# Patient Record
Sex: Male | Born: 1970
Health system: Southern US, Community
[De-identification: ages and names within clinical notes are randomized; demographics above are authoritative.]

## PROBLEM LIST (undated history)

## (undated) DIAGNOSIS — R0981 Nasal congestion: Secondary | ICD-10-CM

## (undated) DIAGNOSIS — B009 Herpesviral infection, unspecified: Secondary | ICD-10-CM

## (undated) HISTORY — PX: NOSE SURGERY: SHX723

---

## 2011-08-11 ENCOUNTER — Other Ambulatory Visit (INDEPENDENT_AMBULATORY_CARE_PROVIDER_SITE_OTHER): Payer: Self-pay | Admitting: Otolaryngology

## 2011-08-19 ENCOUNTER — Ambulatory Visit
Admission: RE | Admit: 2011-08-19 | Discharge: 2011-08-19 | Disposition: A | Discharge status: Home / Self Care | Payer: 59 | Source: Ambulatory Visit | Attending: Otolaryngology | Admitting: Otolaryngology

## 2011-10-23 ENCOUNTER — Encounter: Payer: Self-pay | Admitting: *Deleted

## 2011-10-23 ENCOUNTER — Emergency Department (HOSPITAL_COMMUNITY)
Admission: EM | Admit: 2011-10-23 | Discharge: 2011-10-23 | Disposition: A | Discharge status: Home / Self Care | Payer: 59 | Attending: Emergency Medicine | Admitting: Emergency Medicine

## 2011-10-23 DIAGNOSIS — H53149 Visual discomfort, unspecified: Secondary | ICD-10-CM | POA: Insufficient documentation

## 2011-10-23 DIAGNOSIS — G44019 Episodic cluster headache, not intractable: Secondary | ICD-10-CM | POA: Insufficient documentation

## 2011-10-23 DIAGNOSIS — H5789 Other specified disorders of eye and adnexa: Secondary | ICD-10-CM | POA: Insufficient documentation

## 2011-10-23 HISTORY — DX: Herpesviral infection, unspecified: B00.9

## 2011-10-23 HISTORY — DX: Nasal congestion: R09.81

## 2011-10-23 MED ORDER — PREDNISONE 20 MG PO TABS
40.0000 mg | ORAL_TABLET | Freq: Once | ORAL | Status: AC
  Administered 2011-10-23: 40 mg via ORAL
  Filled 2011-10-23: qty 2

## 2011-10-23 MED ORDER — PREDNISONE 20 MG PO TABS: 40.0000 mg | ORAL_TABLET | Freq: Every day | ORAL | Status: AC

## 2011-10-23 MED ORDER — KETOROLAC TROMETHAMINE 30 MG/ML IJ SOLN
30.0000 mg | Freq: Once | INTRAMUSCULAR | Status: AC
  Administered 2011-10-23: 30 mg via INTRAMUSCULAR
  Filled 2011-10-23: qty 1

## 2011-10-23 NOTE — ED Provider Notes (Signed)
History    40yM with HA. Sudden onset. Sharp, stabbing pain. Episodes last minutes to up to an hour then resolve. R sided. R temporal region and R face. Tearing R eye and photophobia. No change in visual acuity.  Feels congested. No rhinorrhea. Redness R eye. No neck pain or stiffness. Denies trauma. No fever or chills. Similar HAs past couple weeks.   CSN: [id number]  Arrival date & time 10/23/11  1335   First MD Initiated Contact with Patient 10/23/11 1601      Chief Complaint  Patient presents with  . Headache    (Consider location/radiation/quality/duration/timing/severity/associated sxs/prior treatment) HPI  Past Medical History  Diagnosis Date  . Sinus congestion   . Herpes     History reviewed. No pertinent past surgical history.  History reviewed. No pertinent family history.  History  Substance Use Topics  . Smoking status: Never Smoker   . Smokeless tobacco: Not on file  . Alcohol Use: No      Review of Systems   Review of symptoms negative unless otherwise noted in HPI.  Allergies  Review of patient's allergies indicates no known allergies.  Home Medications   Current Outpatient Rx  Name Route Sig Dispense Refill  . ACYCLOVIR 800 MG PO TABS Oral Take 800 mg by mouth 2 (two) times daily.      . ASPIRIN-ACETAMINOPHEN-CAFFEINE 250-250-65 MG PO TABS Oral Take 1 tablet by mouth every 6 (six) hours as needed. For migraine       . PHENYLEPHRINE-IBUPROFEN 10-200 MG PO TABS Oral Take 1 tablet by mouth every 6 (six) hours as needed. For pain     . PREDNISONE 20 MG PO TABS Oral Take 2 tablets (40 mg total) by mouth daily. 8 tablet 0    BP 126/75  Pulse 78  Temp(Src) 98 F (36.7 C) (Oral)  Resp 22  SpO2 100%  Physical Exam  Nursing note and vitals reviewed. Constitutional: He is oriented to person, place, and time. He appears well-developed and well-nourished. No distress.  HENT:  Head: Normocephalic and atraumatic.  Right Ear: External ear normal.    Left Ear: External ear normal.  Mouth/Throat: Oropharynx is clear and moist.       No facial tenderness  Eyes: Conjunctivae are normal. Pupils are equal, round, and reactive to light. Right eye exhibits no discharge. Left eye exhibits no discharge. No scleral icterus.       Mild injection R conjunctiva. Tactile tonometry wnl. Corneas clear.  Neck: Normal range of motion. Neck supple.  Cardiovascular: Normal rate, regular rhythm and normal heart sounds.  Exam reveals no gallop and no friction rub.   No murmur heard. Pulmonary/Chest: Effort normal and breath sounds normal. No respiratory distress.  Abdominal: Soft. He exhibits no distension. There is no tenderness.  Musculoskeletal: He exhibits no edema and no tenderness.  Lymphadenopathy:    He has no cervical adenopathy.  Neurological: He is alert and oriented to person, place, and time. No cranial nerve deficit. He exhibits normal muscle tone. Coordination normal.       Normal appearing gait  Skin: Skin is warm and dry.  Psychiatric: He has a normal mood and affect. His behavior is normal. Thought content normal.    ED Course  Procedures (including critical care time)  Labs Reviewed - No data to display No results found.   1. Episodic cluster headache       MDM  40yM with HA. Suspect primary HA, specifically cluster HA given description of  symptoms and benign exam. Consider secondary causes. Doubt glaucoma given intermittent nature, exam and no visual complaints. Highly unlikely temporal arteritis given age. Doubt bleed or mass. No meningismus. Afebrile. Not on blood thinning medications. No hx of hypercoaguable state, but consider venous thrombosis. Doubt vertebral/carotid artery dissection. Discussed suspicion and plan of action but could tell seemed somewhat dissapointed in suspected diagnosis and plan of action. Will give trial of steroids and encouraged to use nsaids prn. Outpt fu for further tx and discuss other options such  ccb or triptans.        Raeford Razor, MD 10/23/11 [id number]

## 2011-10-23 NOTE — ED Notes (Signed)
Pt reports sudden onset of severe pain to top of head and to face today. Denies n/v. No acute distress noted at triage.

## 2016-12-14 DIAGNOSIS — B009 Herpesviral infection, unspecified: Secondary | ICD-10-CM | POA: Insufficient documentation

## 2016-12-21 ENCOUNTER — Ambulatory Visit (HOSPITAL_COMMUNITY)
Admission: EM | Admit: 2016-12-21 | Discharge: 2016-12-21 | Disposition: A | Discharge status: Home / Self Care | Payer: 59 | Attending: Family Medicine | Admitting: Family Medicine

## 2016-12-21 ENCOUNTER — Encounter (HOSPITAL_COMMUNITY): Payer: Self-pay | Admitting: Emergency Medicine

## 2016-12-21 DIAGNOSIS — R05 Cough: Secondary | ICD-10-CM | POA: Diagnosis not present

## 2016-12-21 DIAGNOSIS — J208 Acute bronchitis due to other specified organisms: Secondary | ICD-10-CM | POA: Diagnosis not present

## 2016-12-21 DIAGNOSIS — R062 Wheezing: Secondary | ICD-10-CM

## 2016-12-21 DIAGNOSIS — R059 Cough, unspecified: Secondary | ICD-10-CM

## 2016-12-21 MED ORDER — ALBUTEROL SULFATE (2.5 MG/3ML) 0.083% IN NEBU
2.5000 mg | INHALATION_SOLUTION | Freq: Once | RESPIRATORY_TRACT | Status: AC
  Administered 2016-12-21: 2.5 mg via RESPIRATORY_TRACT

## 2016-12-21 MED ORDER — AMOXICILLIN 875 MG PO TABS: 875.0000 mg | ORAL_TABLET | Freq: Two times a day (BID) | ORAL | 0 refills | Status: DC

## 2016-12-21 MED ORDER — METHYLPREDNISOLONE 4 MG PO TBPK: ORAL_TABLET | ORAL | 0 refills | Status: DC

## 2016-12-21 MED ORDER — SODIUM CHLORIDE 0.9 % IN NEBU
INHALATION_SOLUTION | RESPIRATORY_TRACT | Status: AC
  Filled 2016-12-21: qty 3

## 2016-12-21 MED ORDER — ALBUTEROL SULFATE HFA 108 (90 BASE) MCG/ACT IN AERS: 1.0000 | INHALATION_SPRAY | Freq: Four times a day (QID) | RESPIRATORY_TRACT | 0 refills | Status: AC | PRN

## 2016-12-21 MED ORDER — ALBUTEROL SULFATE (2.5 MG/3ML) 0.083% IN NEBU
INHALATION_SOLUTION | RESPIRATORY_TRACT | Status: AC
  Filled 2016-12-21: qty 3

## 2016-12-21 MED ORDER — BENZONATATE 100 MG PO CAPS: 200.0000 mg | ORAL_CAPSULE | Freq: Three times a day (TID) | ORAL | 0 refills | Status: DC | PRN

## 2016-12-21 NOTE — ED Provider Notes (Signed)
CSN: [id number]     Arrival date & time 12/21/16  1830 History   First MD Initiated Contact with Patient 12/21/16 1915     Chief Complaint  Patient presents with  . Cough   (Consider location/radiation/quality/duration/timing/severity/associated sxs/prior Treatment) Patient c/o cough and wheezing for 2 weeks.   The history is provided by the patient and the spouse.  Cough  Cough characteristics:  Productive Sputum characteristics:  Yellow Severity:  Severe Onset quality:  Sudden Duration:  2 weeks Timing:  Constant Progression:  Worsening Chronicity:  New Smoker: no   Context: upper respiratory infection and weather changes   Relieved by:  Nothing Worsened by:  Nothing   Past Medical History:  Diagnosis Date  . Herpes   . Sinus congestion    History reviewed. No pertinent surgical history. History reviewed. No pertinent family history. Social History  Substance Use Topics  . Smoking status: Never Smoker  . Smokeless tobacco: Not on file  . Alcohol use No    Review of Systems  Constitutional: Positive for fatigue.  HENT: Negative.   Eyes: Negative.   Respiratory: Positive for cough.   Cardiovascular: Negative.   Gastrointestinal: Negative.   Endocrine: Negative.   Genitourinary: Negative.   Musculoskeletal: Negative.   Skin: Negative.   Allergic/Immunologic: Negative.   Neurological: Negative.   Hematological: Negative.   Psychiatric/Behavioral: Negative.     Allergies  Patient has no known allergies.  Home Medications   Prior to Admission medications   Medication Sig Start Date End Date Taking? Authorizing Provider  albuterol (PROVENTIL HFA;VENTOLIN HFA) 108 (90 Base) MCG/ACT inhaler Inhale 1-2 puffs into the lungs every 6 (six) hours as needed for wheezing or shortness of breath. 12/21/16   Deatra Canter, FNP  amoxicillin (AMOXIL) 875 MG tablet Take 1 tablet (875 mg total) by mouth 2 (two) times daily. 12/21/16   Deatra Canter, FNP  benzonatate  (TESSALON) 100 MG capsule Take 2 capsules (200 mg total) by mouth 3 (three) times daily as needed for cough. 12/21/16   Deatra Canter, FNP  methylPREDNISolone (MEDROL DOSEPAK) 4 MG TBPK tablet Take 6-5-4-3-2-1- po qd 12/21/16   Deatra Canter, FNP   Meds Ordered and Administered this Visit   Medications  albuterol (PROVENTIL) (2.5 MG/3ML) 0.083% nebulizer solution 2.5 mg (2.5 mg Nebulization Given 12/21/16 1931)    BP 129/90 (BP Location: Left Arm)   Pulse 82   Temp 98.9 F (37.2 C) (Oral)   Resp 20   SpO2 98%  No data found.   Physical Exam  Constitutional: He appears well-developed and well-nourished.  HENT:  Head: Normocephalic and atraumatic.  Right Ear: External ear normal.  Left Ear: External ear normal.  Mouth/Throat: Oropharynx is clear and moist.  Eyes: Conjunctivae and EOM are normal. Pupils are equal, round, and reactive to light.  Neck: Normal range of motion. Neck supple.  Cardiovascular: Normal rate, regular rhythm and normal heart sounds.   Pulmonary/Chest: Effort normal. He has wheezes.  Abdominal: Soft. Bowel sounds are normal.  Nursing note and vitals reviewed.   Urgent Care Course     Procedures (including critical care time)  Labs Review Labs Reviewed - No data to display  Imaging Review No results found.   Visual Acuity Review  Right Eye Distance:   Left Eye Distance:   Bilateral Distance:    Right Eye Near:   Left Eye Near:    Bilateral Near:         MDM  1. Acute bronchitis due to other specified organisms   2. Cough   3. Wheezing    Albuterol Neb treatment  Medrol dose pack Albuterol MDI Amoxicillin 875mg  one po bid x 7 days #14 Tessalon Perles 100mg  2 po tid prn       Deatra CanterWilliam J Daelyn Mozer, FNP 12/21/16 2002

## 2016-12-21 NOTE — ED Triage Notes (Signed)
The patient presented to the [facility] with a complaint of a cough x 2 weeks. 

## 2017-08-07 DIAGNOSIS — G43009 Migraine without aura, not intractable, without status migrainosus: Secondary | ICD-10-CM | POA: Insufficient documentation

## 2017-10-31 ENCOUNTER — Encounter (HOSPITAL_COMMUNITY): Payer: Self-pay | Admitting: Emergency Medicine

## 2017-10-31 ENCOUNTER — Other Ambulatory Visit: Payer: Self-pay

## 2017-10-31 ENCOUNTER — Ambulatory Visit (HOSPITAL_COMMUNITY)
Admission: EM | Admit: 2017-10-31 | Discharge: 2017-10-31 | Disposition: A | Discharge status: Home / Self Care | Payer: 59 | Attending: Family Medicine | Admitting: Family Medicine

## 2017-10-31 DIAGNOSIS — J0140 Acute pansinusitis, unspecified: Secondary | ICD-10-CM | POA: Diagnosis not present

## 2017-10-31 MED ORDER — HYDROCOD POLST-CPM POLST ER 10-8 MG/5ML PO SUER: 5.0000 mL | Freq: Every evening | ORAL | 0 refills | Status: DC | PRN

## 2017-10-31 MED ORDER — FLUTICASONE PROPIONATE 50 MCG/ACT NA SUSP: 2.0000 | Freq: Every day | NASAL | 0 refills | Status: DC

## 2017-10-31 MED ORDER — AMOXICILLIN-POT CLAVULANATE 875-125 MG PO TABS: 1.0000 | ORAL_TABLET | Freq: Two times a day (BID) | ORAL | 0 refills | Status: DC

## 2017-10-31 MED ORDER — IPRATROPIUM BROMIDE 0.06 % NA SOLN: 2.0000 | Freq: Four times a day (QID) | NASAL | 0 refills | Status: DC

## 2017-10-31 MED ORDER — CETIRIZINE-PSEUDOEPHEDRINE ER 5-120 MG PO TB12: 1.0000 | ORAL_TABLET | Freq: Every day | ORAL | 0 refills | Status: DC

## 2017-10-31 MED ORDER — BENZONATATE 100 MG PO CAPS: 100.0000 mg | ORAL_CAPSULE | Freq: Three times a day (TID) | ORAL | 0 refills | Status: DC

## 2017-10-31 NOTE — ED Triage Notes (Signed)
Patient started feeling sick last week.  Symptoms are runny nose, headache, cough, and facial pain on right side of face

## 2017-10-31 NOTE — ED Provider Notes (Signed)
[facility]    CSN: [id number] Arrival date & time: 10/31/17  1842     History   Chief Complaint Chief Complaint  Patient presents with  . URI    HPI Brian Padilla is a 47 y.o. male.   [age] year old male comes in for a 1 week history of URI symptoms.  He has had rhinorrhea, headache, nonproductive cough, facial pain, nasal congestion.  Denies fever, chills, night sweats. Denies chest pain, shortness of breath, wheezing. otc nyquil, nasal spray without relief. No sick contact. Never smoker.       Past Medical History:  Diagnosis Date  . Herpes   . Sinus congestion     There are no active problems to display for this patient.   Past Surgical History:  Procedure Laterality Date  . NOSE SURGERY         Home Medications    Prior to Admission medications   Medication Sig Start Date End Date Taking? Authorizing Provider  azelastine (ASTELIN) 0.1 % nasal spray Place into both nostrils 2 (two) times daily. Use in each nostril as directed   Yes [provider]  fluticasone (VERAMYST) 27.5 MCG/SPRAY nasal spray Place 2 sprays into the nose daily.   Yes [provider]  albuterol (PROVENTIL HFA;VENTOLIN HFA) 108 (90 Base) MCG/ACT inhaler Inhale 1-2 puffs into the lungs every 6 (six) hours as needed for wheezing or shortness of breath. 12/21/16   Deatra Canterxford, William J, FNP  amoxicillin-clavulanate (AUGMENTIN) 875-125 MG tablet Take 1 tablet by mouth every 12 (twelve) hours. 10/31/17   Belinda FisherYu, Sigmund Morera V, PA-C    Family History Family History  Problem Relation Age of Onset  . Healthy Mother     Social History Social History   Tobacco Use  . Smoking status: Never Smoker  Substance Use Topics  . Alcohol use: No  . Drug use: No     Allergies   Patient has no known allergies.   Review of Systems Review of Systems  Reason unable to perform ROS: See HPI as above.     Physical Exam Triage Vital Signs ED Triage Vitals  Enc Vitals Group     BP  10/31/17 1955 (!) 136/92     Pulse Rate 10/31/17 1955 70     Resp 10/31/17 1955 20     Temp 10/31/17 1955 (!) 97.2 F (36.2 C)     Temp Source 10/31/17 1955 Oral     SpO2 10/31/17 1955 96 %     Weight --      Height --      Head Circumference --      Peak Flow --      Pain Score 10/31/17 1956 7     Pain Loc --      Pain Edu? --      Excl. in GC? --    No data found.  Updated Vital Signs BP (!) 136/92 (BP Location: Left Arm) Comment: notified RN  Pulse 70   Temp (!) 97.2 F (36.2 C) (Oral) Comment: notified RN  Resp 20   SpO2 96%   Physical Exam  Constitutional: He is oriented to person, place, and time. He appears well-developed and well-nourished. No distress.  HENT:  Head: Normocephalic and atraumatic.  Right Ear: External ear and ear canal normal. Tympanic membrane is erythematous. Tympanic membrane is not bulging.  Left Ear: External ear and ear canal normal. Tympanic membrane is erythematous. Tympanic membrane is not bulging.  Nose: Mucosal edema and rhinorrhea  present. Right sinus exhibits maxillary sinus tenderness and frontal sinus tenderness. Left sinus exhibits maxillary sinus tenderness and frontal sinus tenderness.  Mouth/Throat: Uvula is midline, oropharynx is clear and moist and mucous membranes are normal.  Eyes: Conjunctivae are normal. Pupils are equal, round, and reactive to light.  Neck: Normal range of motion. Neck supple.  Cardiovascular: Normal rate, regular rhythm and normal heart sounds. Exam reveals no gallop and no friction rub.  No murmur heard. Pulmonary/Chest: Effort normal and breath sounds normal. He has no decreased breath sounds. He has no wheezes. He has no rhonchi. He has no rales.  Lymphadenopathy:    He has no cervical adenopathy.  Neurological: He is alert and oriented to person, place, and time.  Skin: Skin is warm and dry.  Psychiatric: He has a normal mood and affect. His behavior is normal. Judgment normal.     UC Treatments /  Results  Labs (all labs ordered are listed, but only abnormal results are displayed) Labs Reviewed - No data to display  EKG  EKG Interpretation None       Radiology No results found.  Procedures Procedures (including critical care time)  Medications Ordered in UC Medications - No data to display   Initial Impression / Assessment and Plan / UC Course  I have reviewed the triage vital signs and the nursing notes.  Pertinent labs & imaging results that were available during my care of the patient were reviewed by me and considered in my medical decision making (see chart for details).    Augmentin for sinusitis. Continue nasal sprays. Return precautions given. Patient expresses understanding and agrees to plan.   Final Clinical Impressions(s) / UC Diagnoses   Final diagnoses:  Acute non-recurrent pansinusitis    ED Discharge Orders        Ordered    fluticasone (FLONASE) 50 MCG/ACT nasal spray  Daily,   Status:  Discontinued     10/31/17 2025    cetirizine-pseudoephedrine (ZYRTEC-D) 5-120 MG tablet  Daily,   Status:  Discontinued     10/31/17 2025    benzonatate (TESSALON) 100 MG capsule  Every 8 hours,   Status:  Discontinued     10/31/17 2025    chlorpheniramine-HYDROcodone (TUSSIONEX PENNKINETIC ER) 10-8 MG/5ML SUER  At bedtime PRN,   Status:  Discontinued     10/31/17 2025    ipratropium (ATROVENT) 0.06 % nasal spray  4 times daily,   Status:  Discontinued     10/31/17 2025    amoxicillin-clavulanate (AUGMENTIN) 875-125 MG tablet  Every 12 hours     10/31/17 2031       Belinda Fisher, PA-C 10/31/17 2055

## 2017-10-31 NOTE — Discharge Instructions (Signed)
Start augmentin for sinus infection. Continue nasal sprays as directed. You can use over the counter nasal saline rinse such as neti pot for nasal congestion. Keep hydrated, your urine should be clear to pale yellow in color. Tylenol/motrin for fever and pain. Monitor for any worsening of symptoms, chest pain, shortness of breath, wheezing, swelling of the throat, follow up for reevaluation.

## 2017-11-22 ENCOUNTER — Ambulatory Visit (HOSPITAL_COMMUNITY)
Admission: EM | Admit: 2017-11-22 | Discharge: 2017-11-22 | Disposition: A | Discharge status: Home / Self Care | Payer: 59 | Attending: Family Medicine | Admitting: Family Medicine

## 2017-11-22 ENCOUNTER — Encounter (HOSPITAL_COMMUNITY): Payer: Self-pay | Admitting: Emergency Medicine

## 2017-11-22 DIAGNOSIS — J069 Acute upper respiratory infection, unspecified: Secondary | ICD-10-CM | POA: Diagnosis not present

## 2017-11-22 DIAGNOSIS — R059 Cough, unspecified: Secondary | ICD-10-CM

## 2017-11-22 DIAGNOSIS — B9789 Other viral agents as the cause of diseases classified elsewhere: Secondary | ICD-10-CM

## 2017-11-22 DIAGNOSIS — R05 Cough: Secondary | ICD-10-CM

## 2017-11-22 MED ORDER — HYDROCOD POLST-CPM POLST ER 10-8 MG/5ML PO SUER: 5.0000 mL | Freq: Every evening | ORAL | 0 refills | Status: DC | PRN

## 2017-11-22 MED ORDER — BENZONATATE 100 MG PO CAPS: 100.0000 mg | ORAL_CAPSULE | Freq: Three times a day (TID) | ORAL | 0 refills | Status: DC

## 2017-11-22 NOTE — Discharge Instructions (Signed)
Please take cough medication as prescribed.  Please make sure you are drinking lots of fluids.  Follow-up for any fevers, worsening cough, shortness of breath, urgent changes in your health.

## 2017-11-22 NOTE — ED Provider Notes (Signed)
[facility]    CSN: [id number] Arrival date & time: 11/22/17  1747     History   Chief Complaint Chief Complaint  Patient presents with  . Cough    HPI Dierdre Harnesseris Mcglocklin is a 47 y.o. male presents to the urgent care facility for evaluation of cough.  Cough is been present for 2 days.  Patient denies any fevers, shortness of breath, chest pain.  Patient had sinus symptoms 3 weeks ago that resolved with treatment.  Patient denies any sore throat, abdominal pain, nausea, vomiting, diarrhea, skin rash.  HPI  Past Medical History:  Diagnosis Date  . Herpes   . Sinus congestion     There are no active problems to display for this patient.   Past Surgical History:  Procedure Laterality Date  . NOSE SURGERY         Home Medications    Prior to Admission medications   Medication Sig Start Date End Date Taking? Authorizing Provider  albuterol (PROVENTIL HFA;VENTOLIN HFA) 108 (90 Base) MCG/ACT inhaler Inhale 1-2 puffs into the lungs every 6 (six) hours as needed for wheezing or shortness of breath. 12/21/16  Yes Deatra Canterxford, William J, FNP  azelastine (ASTELIN) 0.1 % nasal spray Place into both nostrils 2 (two) times daily. Use in each nostril as directed   Yes [provider]  fluticasone (VERAMYST) 27.5 MCG/SPRAY nasal spray Place 2 sprays into the nose daily.   Yes [provider]  amoxicillin-clavulanate (AUGMENTIN) 875-125 MG tablet Take 1 tablet by mouth every 12 (twelve) hours. 10/31/17   Cathie HoopsYu, Amy V, PA-C  benzonatate (TESSALON) 100 MG capsule Take 1 capsule (100 mg total) by mouth every 8 (eight) hours. 11/22/17   Evon SlackGaines, Thomas C, PA-C  chlorpheniramine-HYDROcodone (TUSSIONEX PENNKINETIC ER) 10-8 MG/5ML SUER Take 5 mLs by mouth at bedtime as needed for cough. 11/22/17   Evon SlackGaines, Thomas C, PA-C    Family History Family History  Problem Relation Age of Onset  . Healthy Mother     Social History Social History   Tobacco Use  . Smoking status:  Never Smoker  Substance Use Topics  . Alcohol use: No  . Drug use: No     Allergies   Patient has no known allergies.   Review of Systems Review of Systems  Constitutional: Negative for fever.  HENT: Positive for congestion. Negative for rhinorrhea, sinus pressure, sinus pain and sore throat.   Respiratory: Positive for cough. Negative for wheezing and stridor.   Gastrointestinal: Negative for diarrhea, nausea and vomiting.  Musculoskeletal: Negative for arthralgias, myalgias and neck stiffness.  Skin: Negative for rash.  Neurological: Negative for dizziness.     Physical Exam Triage Vital Signs ED Triage Vitals [11/22/17 1824]  Enc Vitals Group     BP (!) 144/93     Pulse Rate 79     Resp 16     Temp 98.6 F (37 C)     Temp Source Oral     SpO2 100 %     Weight 220 lb (99.8 kg)     Height      Head Circumference      Peak Flow      Pain Score 0     Pain Loc      Pain Edu?      Excl. in GC?    No data found.  Updated Vital Signs BP (!) 144/93   Pulse 79   Temp 98.6 F (37 C) (Oral)   Resp 16  Wt 220 lb (99.8 kg)   SpO2 100%   Visual Acuity Right Eye Distance:   Left Eye Distance:   Bilateral Distance:    Right Eye Near:   Left Eye Near:    Bilateral Near:     Physical Exam  Constitutional: He is oriented to person, place, and time. He appears well-developed and well-nourished. No distress.  HENT:  Head: Normocephalic and atraumatic.  Right Ear: Hearing, tympanic membrane, external ear and ear canal normal.  Left Ear: Hearing, tympanic membrane, external ear and ear canal normal.  Nose: Rhinorrhea present. No nasal septal hematoma. Right sinus exhibits no maxillary sinus tenderness and no frontal sinus tenderness. Left sinus exhibits no maxillary sinus tenderness and no frontal sinus tenderness.  Mouth/Throat: No trismus in the jaw. No uvula swelling. No oropharyngeal exudate, posterior oropharyngeal edema, posterior oropharyngeal erythema or  tonsillar abscesses.  Eyes: Conjunctivae are normal.  Neck: Normal range of motion.  Cardiovascular: Normal rate and regular rhythm.  Pulmonary/Chest: Effort normal. No stridor. No respiratory distress. He has no wheezes. He has no rales. He exhibits no tenderness.  Abdominal: Soft. He exhibits no distension. There is no tenderness. There is no guarding.  Musculoskeletal: Normal range of motion.  Neurological: He is alert and oriented to person, place, and time.  Skin: Skin is warm and dry. No rash noted.  Psychiatric: He has a normal mood and affect. His behavior is normal. Judgment and thought content normal.     UC Treatments / Results  Labs (all labs ordered are listed, but only abnormal results are displayed) Labs Reviewed - No data to display  EKG  EKG Interpretation None       Radiology No results found.  Procedures Procedures (including critical care time)  Medications Ordered in UC Medications - No data to display   Initial Impression / Assessment and Plan / UC Course  I have reviewed the triage vital signs and the nursing notes.  Pertinent labs & imaging results that were available during my care of the patient were reviewed by me and considered in my medical decision making (see chart for details).     47 year old male with 2-day history of cough.  Cough is nonproductive.  Vital signs within normal limits, he normal O2 saturations, not tachycardic with no fever.  Patient encouraged to continue with over-the-counter medications, take Tessalon Perles during the day and is given a prescription for nighttime cough medication.  He is educated on signs symptoms return to the clinic for.  Final Clinical Impressions(s) / UC Diagnoses   Final diagnoses:  Cough  Viral URI with cough    ED Discharge Orders        Ordered    chlorpheniramine-HYDROcodone (TUSSIONEX PENNKINETIC ER) 10-8 MG/5ML SUER  At bedtime PRN     11/22/17 1856    benzonatate (TESSALON) 100 MG  capsule  Every 8 hours     11/22/17 1856         Evon Slack, PA-C 11/22/17 1900

## 2017-11-22 NOTE — ED Triage Notes (Signed)
PT reports a head cold 2 weeks ago. PT took amoxicillin. Cough started Sunday.

## 2018-01-12 ENCOUNTER — Encounter: Payer: Self-pay | Admitting: Neurology

## 2018-02-20 ENCOUNTER — Encounter: Payer: Self-pay | Admitting: Neurology

## 2018-04-25 ENCOUNTER — Encounter: Payer: Self-pay | Admitting: Neurology

## 2018-04-25 ENCOUNTER — Other Ambulatory Visit (INDEPENDENT_AMBULATORY_CARE_PROVIDER_SITE_OTHER): Payer: 59

## 2018-04-25 ENCOUNTER — Ambulatory Visit (INDEPENDENT_AMBULATORY_CARE_PROVIDER_SITE_OTHER): Payer: 59 | Admitting: Neurology

## 2018-04-25 VITALS — BP 124/96 | HR 79 | Ht 76.0 in | Wt 252.0 lb

## 2018-04-25 DIAGNOSIS — G43009 Migraine without aura, not intractable, without status migrainosus: Secondary | ICD-10-CM | POA: Diagnosis not present

## 2018-04-25 DIAGNOSIS — Z8669 Personal history of other diseases of the nervous system and sense organs: Secondary | ICD-10-CM | POA: Diagnosis not present

## 2018-04-25 DIAGNOSIS — Z79899 Other long term (current) drug therapy: Secondary | ICD-10-CM

## 2018-04-25 LAB — BASIC METABOLIC PANEL
BUN: 14 mg/dL (ref 6–23)
CALCIUM: 9.3 mg/dL (ref 8.4–10.5)
CO2: 30 meq/L (ref 19–32)
Chloride: 102 mEq/L (ref 96–112)
Creatinine, Ser: 0.97 mg/dL (ref 0.40–1.50)
GFR: 107.66 mL/min (ref >60.00)
GLUCOSE: 98 mg/dL (ref 70–99)
POTASSIUM: 3.5 meq/L (ref 3.5–5.1)
SODIUM: 139 meq/L (ref 135–145)

## 2018-04-25 MED ORDER — SUMATRIPTAN SUCCINATE 100 MG PO TABS: ORAL_TABLET | ORAL | 2 refills | Status: AC

## 2018-04-25 MED ORDER — TOPIRAMATE 50 MG PO TABS
50.0000 mg | ORAL_TABLET | Freq: Every day | ORAL | 2 refills | Status: DC
Start: 2018-04-25 — End: 2018-08-09

## 2018-04-25 NOTE — Patient Instructions (Addendum)
  1.  Start topiramate 50mg  at bedtime.  Contact me in 4 weeks with update and we can adjust dose if needed. 2.  Take sumatriptan 100mg  at earliest onset of headache.  May repeat dose once in 2 hours if needed.  Do not exceed two tablets in 24 hours. 3.  Limit use of pain relievers to no more than 2 days out of the week.  These medications include acetaminophen, ibuprofen, triptans and narcotics.  This will help reduce risk of rebound headaches.  Limit use of decongestants as well. 4.  Be aware of common food triggers such as processed sweets, processed foods with nitrites (such as deli meat, hot dogs, sausages), foods with MSG, alcohol (such as wine), chocolate, certain cheeses, certain fruits (dried fruits, bananas, some citrus fruit), vinegar, diet soda. 4.  Avoid caffeine 5.  Routine exercise 6.  Proper sleep hygiene 7.  Stay adequately hydrated with water 8.  Keep a headache diary. 9.  Maintain proper stress management. 10.  Do not skip meals. 11.  Consider supplements:  Magnesium citrate 400mg  to 600mg  daily, riboflavin 400mg , Coenzyme Q 10 100mg  three times daily. 12. We will check BMP 13.  Follow up in 3 months.  Your provider has requested that you have labwork completed today. Please go to [facility] ([id number]) on the second floor of this building before leaving the office today. You do not need to check in. If you are not called within 15 minutes please check with the front desk.

## 2018-04-25 NOTE — Progress Notes (Signed)
NEUROLOGY CONSULTATION NOTE  Brian Padilla MRN: [id number] DOB: 10-25-1971  Referring provider: Lindaann Pascal, PA-C Primary care provider: no PCP  Reason for consult:  headache  HISTORY OF PRESENT ILLNESS: Brian Padilla is a 47 year old left-handed male who presents for headaches.  History supplemented by referring provider's note  Onset:  Many years.  He has history of cluster headaches.  Current headaches are similar but not as severe.  He initially underwent sinus surgery in 2013-2014, but headaches persisted. Location:  Right sided (frontal/temporal) Quality:  Non-throbbing pressure, rarely sharp Intensity:  4/10.  He denies new headache, thunderclap headache or severe headache that wakes him from sleep. Aura:  no Prodrome:  no Postdrome:  no Associated symptoms:  Right eye tears/conjunctival injection, nasal congestion, photophobia.  She denies associated nausea, vomiting, visual disturbance, unilateral numbness or weakness. Duration:  2 hours Frequency:  Daily, in the morning Frequency of abortive medication: Excedrin 2-3 days a week Triggers/exacerbating factors:  Alcohol, caffeine Relieving factors:  Steamed shower Activity:  Does not aggravate  Rescue protocol:  Excedrin Migraine with vapor rub to the nose, Coke Current NSAIDS:  ibuprofen 800mg  Current analgesics:  hydrocodone Current triptans:  Maxalt MLT 10mg  Current anti-emetic:  no Current muscle relaxants:  no Current anti-anxiolytic:  no Current sleep aide:  no Current Antihypertensive medications:  no Current Antidepressant medications:  no Current Anticonvulsant medications:  no Current Vitamins/Herbal/Supplements:  no Current Antihistamines/Decongestants:  Azelastine nasal spray Other therapy:  Vapor rub, Coke  Past NSAIDS:  Ibuprofen 800mg  Past analgesics:  hydrocodone Past abortive triptans:  Maxalt MLT 10mg  Past muscle relaxants:  no Past anti-emetic:  no Past antihypertensive medications:   Verapamil 120mg  four times daily Past antidepressant medications:  no Past anticonvulsant medications:  topiramate 25mg  daily Past vitamins/Herbal/Supplements:  no Past antihistamines/decongestants:  no Other past therapies:  prednisone  Caffeine:  Coke daily Alcohol:  On weekends Smoker:  no Diet:  hydrates Exercise:  no Depression:  no; Anxiety:  no Other pain:  no Sleep hygiene:  varies  PAST MEDICAL HISTORY: Past Medical History:  Diagnosis Date  . Herpes   . Sinus congestion     PAST SURGICAL HISTORY: Past Surgical History:  Procedure Laterality Date  . NOSE SURGERY      MEDICATIONS: Current Outpatient Medications on File Prior to Visit  Medication Sig Dispense Refill  . albuterol (PROVENTIL HFA;VENTOLIN HFA) 108 (90 Base) MCG/ACT inhaler Inhale 1-2 puffs into the lungs every 6 (six) hours as needed for wheezing or shortness of breath. 1 Inhaler 0  . amoxicillin-clavulanate (AUGMENTIN) 875-125 MG tablet Take 1 tablet by mouth every 12 (twelve) hours. (Patient not taking: Reported on 04/25/2018) 14 tablet 0  . azelastine (ASTELIN) 0.1 % nasal spray Place into both nostrils 2 (two) times daily. Use in each nostril as directed    . benzonatate (TESSALON) 100 MG capsule Take 1 capsule (100 mg total) by mouth every 8 (eight) hours. (Patient not taking: Reported on 04/25/2018) 21 capsule 0  . chlorpheniramine-HYDROcodone (TUSSIONEX PENNKINETIC ER) 10-8 MG/5ML SUER Take 5 mLs by mouth at bedtime as needed for cough. (Patient not taking: Reported on 04/25/2018) 60 mL 0  . fluticasone (VERAMYST) 27.5 MCG/SPRAY nasal spray Place 2 sprays into the nose daily.     No current facility-administered medications on file prior to visit.     ALLERGIES: No Known Allergies  FAMILY HISTORY: Family History  Problem Relation Age of Onset  . Healthy Mother   . CVA Maternal Grandmother   .  CVA Paternal Grandmother   . Brain cancer Paternal Grandfather     SOCIAL HISTORY: Social History     Socioeconomic History  . Marital status: Married    Spouse name: Brian Padilla  . Number of children: 3  . Years of education: Not on file  . Highest education level: Associate degree: academic program  Occupational History    Employer: [employer]  Social Needs  . Financial resource strain: Not on file  . Food insecurity:    Worry: Not on file    Inability: Not on file  . Transportation needs:    Medical: Not on file    Non-medical: Not on file  Tobacco Use  . Smoking status: Never Smoker  . Smokeless tobacco: Never Used  Substance and Sexual Activity  . Alcohol use: Yes    Alcohol/week: 1.2 oz    Types: 2 Shots of liquor per week  . Drug use: No  . Sexual activity: Not on file  Lifestyle  . Physical activity:    Days per week: Not on file    Minutes per session: Not on file  . Stress: Not on file  Relationships  . Social connections:    Talks on phone: Not on file    Gets together: Not on file    Attends religious service: Not on file    Active member of club or organization: Not on file    Attends meetings of clubs or organizations: Not on file    Relationship status: Not on file  . Intimate partner violence:    Fear of current or ex partner: Not on file    Emotionally abused: Not on file    Physically abused: Not on file    Forced sexual activity: Not on file  Other Topics Concern  . Not on file  Social History Narrative   Patient is left-handed. He lives with his wife and daughter in  2 story house. He drinks 1 soda a day. He has not been exercising.    REVIEW OF SYSTEMS: Constitutional: No fevers, chills, or sweats, no generalized fatigue, change in appetite Eyes: No visual changes, double vision, eye pain Ear, nose and throat: No hearing loss, ear pain, nasal congestion, sore throat Cardiovascular: No chest pain, palpitations Respiratory:  No shortness of breath at rest or with exertion, wheezes GastrointestinaI: No nausea, vomiting, diarrhea, abdominal pain,  fecal incontinence Genitourinary:  No dysuria, urinary retention or frequency Musculoskeletal:  No neck pain, back pain Integumentary: No rash, pruritus, skin lesions Neurological: as above Psychiatric: No depression, insomnia, anxiety Endocrine: No palpitations, fatigue, diaphoresis, mood swings, change in appetite, change in weight, increased thirst Hematologic/Lymphatic:  No purpura, petechiae. Allergic/Immunologic: no itchy/runny eyes, nasal congestion, recent allergic reactions, rashes  PHYSICAL EXAM: Vitals:   04/25/18 0753  BP: (!) 124/96  Pulse: 79  SpO2: 95%   General: No acute distress.  Patient appears well-groomed.  Head:  Normocephalic/atraumatic Eyes:  fundi examined but not visualized Neck: supple, no paraspinal tenderness, full range of motion Back: No paraspinal tenderness Heart: regular rate and rhythm Lungs: Clear to auscultation bilaterally. Vascular: No carotid bruits. Neurological Exam: Mental status: alert and oriented to person, place, and time, recent and remote memory intact, fund of knowledge intact, attention and concentration intact, speech fluent and not dysarthric, language intact. Cranial nerves: CN I: not tested CN II: pupils equal, round and reactive to light, visual fields intact CN III, IV, VI:  full range of motion, no nystagmus, no ptosis CN V: facial  sensation intact CN VII: upper and lower face symmetric CN VIII: hearing intact CN IX, X: gag intact, uvula midline CN XI: sternocleidomastoid and trapezius muscles intact CN XII: tongue midline Bulk & Tone: normal, no fasciculations. Motor:  5/5 throughout  Sensation: temperature and vibration sensation intact. Deep Tendon Reflexes:  2+ throughout, toes downgoing.  Finger to nose testing:  Without dysmetria.  Heel to shin:  Without dysmetria.  Gait:  Normal station and stride.  Able to turn and tandem walk. Romberg negative.  IMPRESSION: Chronic daily headache, not intractable.  He has  history of cluster headaches, which are severe.  These headaches are consistent with cluster headaches, however intensity is low.  Possibly migraine.  PLAN: 1.  He was previously on low dose of topiramate.  Restart at 50mg  at bedtime and may increase dose in 4 weeks if needed.  Side effects discussed.  Will check baseline BMP. 2.  For abortive therapy, he will try sumatriptan 100mg .  Otherwise may take Excedrin.  However, instructed to limit use of pain relievers to no more than 2 days out of week to prevent rebound headache.  Advised to limit use of decongestants to prevent rebound effect. 3.  Headache diary 4.  Exercise, sleep hygiene 5.  Avoid caffeine 6.  Stay adequately hydrated with water 7.  Follow up in 3 months.  Thank you for allowing me to take part in the care of this patient.  40 minutes spent face to face with patient, over 50% spent discussing management.  Shon MilletAdam Georgeann Brinkman, DO  CC:  Lindaann PascalScott Long, PA-C

## 2018-05-03 ENCOUNTER — Telehealth: Payer: Self-pay | Admitting: Neurology

## 2018-05-03 NOTE — Telephone Encounter (Signed)
Patient came in today and states medication trpiramate is not working and would like to discuss a different medication. Pt also wants to know what foods to stay away from to avoid migraines.

## 2018-05-04 NOTE — Telephone Encounter (Signed)
Called and advised Pt to take 1/2 tab for 1 week, then return to 50 mg. He will call if any further questions. I did also advise him of foods to avoid, as well as caffeine.

## 2018-05-04 NOTE — Telephone Encounter (Signed)
Yes  That would be appropriate

## 2018-05-04 NOTE — Telephone Encounter (Signed)
Called and spoke with Pt. He feels like the topiramate was not helping. I explained to him he would need to take it for at least 4 weeks to see effects.  Pt said he was exhausted yesterday, he came home went to bed, slept for a few hours, woke up ate very little, then back to bed and slept all night. Did not take topiramate last night. He says feels the best he has in a while this morning.  I advised him it sounds as if he needed to rest (he DJ's at night).  He never developed a headache yesterday and did not have to take anything, though felt like he was, eye watering.

## 2018-08-08 NOTE — Progress Notes (Signed)
NEUROLOGY FOLLOW UP OFFICE NOTE  Brian Padilla [id number]  HISTORY OF PRESENT ILLNESS: Brian Padilla is a 47 year old left-handed male who follows up for cluster headache.  UPDATE: Right sided pressure is constant. Intensity:  5/10 Duration:  constant Frequency:  daily Acute therapy:  Azelastine spray, ibuprofen or Excedrin Frequency of abortive medication: takes something every other day Current NSAIDS:  Ibuprofen 800mg  (helps) Current analgesics:  Excedrin Current triptans:  Sumatriptan 100mg  (has not taken it because it is not severe) Current ergotamine:  none Current anti-emetic:  none Current muscle relaxants:  none Current anti-anxiolytic:  none Current sleep aide:  none Current Antihypertensive medications:  none Current Antidepressant medications:  none Current Anticonvulsant medications:  topiramate 25mg  at bedtime (50mg  was too strong) Current anti-CGRP:  none Current Vitamins/Herbal/Supplements:  none Current Antihistamines/Decongestants:  Azelastine nasal spray Other therapy:  Vapor rub, Coke  Caffeine:  Coke Alcohol:  On weekends Smoker:  no Diet:  hydrates Exercise:  no Depression:  no; Anxiety:  no Other pain:  no Sleep hygiene:  varies  04/25/18 BMP:  Na 139, K 3.5, Cl 102, CO2 30, glucose 98, BUN 14, Cr 0.97  HISTORY:  Onset: Many years. He has history of cluster headaches.  Current headaches are similar but not as severe.  He initially underwent sinus surgery in 2013-2014, but headaches persisted. Location:  Right sided (frontal/temporal) Quality:  Non-throbbing pressure, rarely sharp Intensity:  4/10.  He denies new headache, thunderclap headache or severe headache that wakes him from sleep. Aura:  no Prodrome:  no Postdrome:  no Associated symptoms: Right eye tears/conjunctival injection, nasal congestion, photophobia.  She denies associated nausea, vomiting, visual disturbance, unilateral numbness or weakness. Duration:  2 hours Frequency:   Daily, in the morning Frequency of abortive medication: Excedrin 2-3 days a week Triggers:  Alcohol, caffeine Relieving factors:  Steamed shower Activity:  Does not aggravate  Past NSAIDS:  Ibuprofen 800mg  Past analgesics:  hydrocodone Past abortive triptans:  Maxalt MLT 10mg  Past muscle relaxants:  no Past anti-emetic:  no Past antihypertensive medications:  Verapamil 120mg  four times daily Past antidepressant medications:  no Past anticonvulsant medications:  topiramate 25mg  daily Past vitamins/Herbal/Supplements:  no Past antihistamines/decongestants:  no Other past therapies:  prednisone  PAST MEDICAL HISTORY: Past Medical History:  Diagnosis Date  . Herpes   . Sinus congestion     MEDICATIONS: Current Outpatient Medications on File Prior to Visit  Medication Sig Dispense Refill  . albuterol (PROVENTIL HFA;VENTOLIN HFA) 108 (90 Base) MCG/ACT inhaler Inhale 1-2 puffs into the lungs every 6 (six) hours as needed for wheezing or shortness of breath. 1 Inhaler 0  . amoxicillin-clavulanate (AUGMENTIN) 875-125 MG tablet Take 1 tablet by mouth every 12 (twelve) hours. (Patient not taking: Reported on 04/25/2018) 14 tablet 0  . azelastine (ASTELIN) 0.1 % nasal spray Place into both nostrils 2 (two) times daily. Use in each nostril as directed    . benzonatate (TESSALON) 100 MG capsule Take 1 capsule (100 mg total) by mouth every 8 (eight) hours. (Patient not taking: Reported on 04/25/2018) 21 capsule 0  . chlorpheniramine-HYDROcodone (TUSSIONEX PENNKINETIC ER) 10-8 MG/5ML SUER Take 5 mLs by mouth at bedtime as needed for cough. (Patient not taking: Reported on 04/25/2018) 60 mL 0  . fluticasone (VERAMYST) 27.5 MCG/SPRAY nasal spray Place 2 sprays into the nose daily.    . SUMAtriptan (IMITREX) 100 MG tablet Take 1 tablet earliest onset of headache.  May repeat once in 2 hours if headache persists  or recurs.  Do not exceed 2 tablets in 24 hours 10 tablet 2  . topiramate (TOPAMAX) 50 MG  tablet Take 1 tablet (50 mg total) by mouth at bedtime. 30 tablet 2   No current facility-administered medications on file prior to visit.     ALLERGIES: No Known Allergies  FAMILY HISTORY: Family History  Problem Relation Age of Onset  . Healthy Mother   . CVA Maternal Grandmother   . CVA Paternal Grandmother   . Brain cancer Paternal Grandfather     SOCIAL HISTORY: Social History   Socioeconomic History  . Marital status: Married    Spouse name: Valyncie  . Number of children: 3  . Years of education: Not on file  . Highest education level: Associate degree: academic program  Occupational History    Employer: [employer]  Social Needs  . Financial resource strain: Not on file  . Food insecurity:    Worry: Not on file    Inability: Not on file  . Transportation needs:    Medical: Not on file    Non-medical: Not on file  Tobacco Use  . Smoking status: Never Smoker  . Smokeless tobacco: Never Used  Substance and Sexual Activity  . Alcohol use: Yes    Alcohol/week: 2.0 standard drinks    Types: 2 Shots of liquor per week  . Drug use: No  . Sexual activity: Not on file  Lifestyle  . Physical activity:    Days per week: Not on file    Minutes per session: Not on file  . Stress: Not on file  Relationships  . Social connections:    Talks on phone: Not on file    Gets together: Not on file    Attends religious service: Not on file    Active member of club or organization: Not on file    Attends meetings of clubs or organizations: Not on file    Relationship status: Not on file  . Intimate partner violence:    Fear of current or ex partner: Not on file    Emotionally abused: Not on file    Physically abused: Not on file    Forced sexual activity: Not on file  Other Topics Concern  . Not on file  Social History Narrative   Patient is left-handed. He lives with his wife and daughter in  2 story house. He drinks 1 soda a day. He has not been exercising.    REVIEW OF  SYSTEMS: Constitutional: No fevers, chills, or sweats, no generalized fatigue, change in appetite Eyes: No visual changes, double vision, eye pain Ear, nose and throat: No hearing loss, ear pain, nasal congestion, sore throat Cardiovascular: No chest pain, palpitations Respiratory:  No shortness of breath at rest or with exertion, wheezes GastrointestinaI: No nausea, vomiting, diarrhea, abdominal pain, fecal incontinence Genitourinary:  No dysuria, urinary retention or frequency Musculoskeletal:  No neck pain, back pain Integumentary: No rash, pruritus, skin lesions Neurological: as above Psychiatric: No depression, insomnia, anxiety Endocrine: No palpitations, fatigue, diaphoresis, mood swings, change in appetite, change in weight, increased thirst Hematologic/Lymphatic:  No purpura, petechiae. Allergic/Immunologic: no itchy/runny eyes, nasal congestion, recent allergic reactions, rashes  PHYSICAL EXAM: Blood pressure 122/90, pulse 73, height 6\' 3"  (1.905 m), weight 249 lb (112.9 kg), SpO2 97 %. General: No acute distress.  Patient appears well-groomed.   Head:  Normocephalic/atraumatic Eyes:  Fundi examined but not visualized Neck: supple, no paraspinal tenderness, full range of motion Heart:  Regular rate and  rhythm Lungs:  Clear to auscultation bilaterally Back: No paraspinal tenderness Neurological Exam: alert and oriented to person, place, and time. Attention span and concentration intact, recent and remote memory intact, fund of knowledge intact.  Speech fluent and not dysarthric, language intact.  CN II-XII intact. Bulk and tone normal, muscle strength 5/5 throughout.  Sensation to light touch  intact.  Deep tendon reflexes 2+ throughout.  Finger to nose testing intact.  Gait normal, Romberg negative.  IMPRESSION: Chronic cluster headache, intractable  PLAN: 1.  Start Emgality, first dose today 2.  Continue topiramate 25mg  at bedtime for now.  We have option to increase dose  in future if needed. 3.  Limit use of pain relievers to no more than 2 days out of week to prevent risk of rebound or medication-overuse headache. 4.  Keep headache diary 5.  As these are now constant headache, will check MRI of brain with and without contrast to evaluate for secondary intracranial etiology 6.  Follow up in 3 to 4 months.  Shon Millet, DO

## 2018-08-09 ENCOUNTER — Ambulatory Visit (INDEPENDENT_AMBULATORY_CARE_PROVIDER_SITE_OTHER): Payer: 59 | Admitting: Neurology

## 2018-08-09 ENCOUNTER — Encounter: Payer: Self-pay | Admitting: Neurology

## 2018-08-09 VITALS — BP 122/90 | HR 73 | Ht 75.0 in | Wt 249.0 lb

## 2018-08-09 DIAGNOSIS — G44021 Chronic cluster headache, intractable: Secondary | ICD-10-CM

## 2018-08-09 MED ORDER — TOPIRAMATE 25 MG PO TABS: 25.0000 mg | ORAL_TABLET | Freq: Every day | ORAL | 3 refills | Status: DC

## 2018-08-09 MED ORDER — GALCANEZUMAB-GNLM 120 MG/ML ~~LOC~~ SOSY: 120.0000 mg | PREFILLED_SYRINGE | SUBCUTANEOUS | 0 refills | Status: AC

## 2018-08-09 NOTE — Patient Instructions (Addendum)
1.  We will start Emgality, a monthly self-injection medication to help reduce headache 2.  Continue topiramate 25mg  at bedtime for now.  We have the option to increase dose later if needed 3.  Limit use of pain relievers to no more than 2 days out of week to prevent risk of rebound or medication-overuse headache. 4.  Keep headache diary 5.  Will check MRI of brain with and without contrast 6.  Follow up in 3 to 4 months.  We have sent a referral to [facility] for your MRI and they will call you directly to schedule your appt. They are located at [address] [facility]. If you need to contact them directly please call [phone].

## 2018-09-03 ENCOUNTER — Ambulatory Visit
Admission: RE | Admit: 2018-09-03 | Discharge: 2018-09-03 | Disposition: A | Discharge status: Home / Self Care | Payer: 59 | Source: Ambulatory Visit | Attending: Neurology | Admitting: Neurology

## 2018-09-03 DIAGNOSIS — G44021 Chronic cluster headache, intractable: Secondary | ICD-10-CM

## 2018-09-03 MED ORDER — GADOBENATE DIMEGLUMINE 529 MG/ML IV SOLN
20.0000 mL | Freq: Once | INTRAVENOUS | Status: AC | PRN
Start: 2018-09-03 — End: 2018-09-03
  Administered 2018-09-03: 20 mL via INTRAVENOUS

## 2018-09-06 ENCOUNTER — Telehealth: Payer: Self-pay

## 2018-09-06 NOTE — Telephone Encounter (Signed)
Called and advised Pt of results. 

## 2018-09-06 NOTE — Telephone Encounter (Signed)
-----   Message from Drema DallasAdam R Jaffe, DO sent at 09/04/2018 12:25 PM EST ----- MRI is unremarkable

## 2018-12-11 NOTE — Progress Notes (Signed)
NEUROLOGY FOLLOW UP OFFICE NOTE  Brian Padilla [id number]  HISTORY OF PRESENT ILLNESS: Brian Padilla is a 48 year old left-handed African American man with follow up for cluster headache.  UPDATE: As he endorsed new persistent headache, he had an MRI of brain with and without contrast on 09/03/18, which was personally reviewed and was normal.  He never started [facility] because he said he never received a sample.  He was provided a copay card but didn't activate.  Therefore the headaches are unchanged. Constant moderate headache.  Episodic severe headaches lasting several hours to a day, occurring once a week. Acute therapy:  Azelastine spray (daily), Excedrin (2 days a week) Frequency of abortive medication: takes something every other day Current NSAIDS: none Current analgesics: Excedrin Current triptans:  Sumatriptan 100mg  (has not taken it because it is not severe) Current ergotamine:  none Current anti-emetic:  none Current muscle relaxants:  none Current anti-anxiolytic:  none Current sleep aide:  none Current Antihypertensive medications:  none Current Antidepressant medications:  none Current Anticonvulsant medications:  topiramate 12.5mg  at bedtime Current anti-CGRP:  none Current Vitamins/Herbal/Supplements:  none Current Antihistamines/Decongestants:  Azelastine nasal spray Other therapy:  Vapor rub, Coke  Caffeine:  Coke Alcohol:  Stopped last month. Smoker:  no Diet:  hydrates Exercise:  no Depression:  no; Anxiety:  no Other pain:  no Sleep hygiene:  varies  HISTORY:  Onset: Since his 70s..He has history of cluster headaches. Current headaches are similar but not as severe. He initially underwent sinus surgery in 2013-2014, but headaches persisted. Location:Right sided (frontal/temporal) Quality:Non-throbbing pressure, rarely sharp Initial intensity:4/10.Hedenies new headache, thunderclap headache or severe headache that wakes himfrom  sleep. Aura:no Prodrome:no Postdrome:no Associated symptoms: Right eye lacrimation/conjunctival injection, nasal congestion, photophobia. She denies associated nausea, vomiting, visual disturbance,unilateral numbness or weakness. Initial duration:2 hours Initial Frequency:Daily, in the morning Initial Frequency of abortive medication:Excedrin 2-3 days a week Triggers: Alcohol, caffeine Relieving factors: Steam shower Activity:Does not aggravate  Past NSAIDS:Ibuprofen 800mg  Past steroid:  Prednisone taper (effective but returns after finishing taper) Past analgesics:hydrocodone Past abortive triptans:Maxalt MLT 10mg  Past muscle relaxants:no Past anti-emetic:no Past antihypertensive medications:Verapamil 120mg  four times daily Past antidepressant medications: None Past anticonvulsant medications:topiramate 25mg  daily Past vitamins/Herbal/Supplements:no Past antihistamines/decongestants:no Other past therapies:prednisone  PAST MEDICAL HISTORY: Past Medical History:  Diagnosis Date  . Herpes   . Sinus congestion     MEDICATIONS: Current Outpatient Medications on File Prior to Visit  Medication Sig Dispense Refill  . albuterol (PROVENTIL HFA;VENTOLIN HFA) 108 (90 Base) MCG/ACT inhaler Inhale 1-2 puffs into the lungs every 6 (six) hours as needed for wheezing or shortness of breath. 1 Inhaler 0  . amoxicillin-clavulanate (AUGMENTIN) 875-125 MG tablet Take 1 tablet by mouth every 12 (twelve) hours. (Patient not taking: Reported on 04/25/2018) 14 tablet 0  . azelastine (ASTELIN) 0.1 % nasal spray Place into both nostrils 2 (two) times daily. Use in each nostril as directed    . benzonatate (TESSALON) 100 MG capsule Take 1 capsule (100 mg total) by mouth every 8 (eight) hours. (Patient not taking: Reported on 04/25/2018) 21 capsule 0  . chlorpheniramine-HYDROcodone (TUSSIONEX PENNKINETIC ER) 10-8 MG/5ML SUER Take 5 mLs by mouth at bedtime as needed for  cough. (Patient not taking: Reported on 04/25/2018) 60 mL 0  . fluticasone (VERAMYST) 27.5 MCG/SPRAY nasal spray Place 2 sprays into the nose daily.    . Galcanezumab-gnlm (EMGALITY) 120 MG/ML SOSY Inject 120 mg into the skin every 30 (thirty) days. 2 Syringe 0  .  SUMAtriptan (IMITREX) 100 MG tablet Take 1 tablet earliest onset of headache.  May repeat once in 2 hours if headache persists or recurs.  Do not exceed 2 tablets in 24 hours 10 tablet 2  . topiramate (TOPAMAX) 25 MG tablet Take 1 tablet (25 mg total) by mouth at bedtime. 30 tablet 3   No current facility-administered medications on file prior to visit.     ALLERGIES: No Known Allergies  FAMILY HISTORY: Family History  Problem Relation Age of Onset  . Healthy Mother   . CVA Maternal Grandmother   . CVA Paternal Grandmother   . Brain cancer Paternal Grandfather    SOCIAL HISTORY: Social History   Socioeconomic History  . Marital status: Married    Spouse name: Valyncie  . Number of children: 3  . Years of education: Not on file  . Highest education level: Associate degree: academic program  Occupational History    Employer: [employer]  Social Needs  . Financial resource strain: Not on file  . Food insecurity:    Worry: Not on file    Inability: Not on file  . Transportation needs:    Medical: Not on file    Non-medical: Not on file  Tobacco Use  . Smoking status: Never Smoker  . Smokeless tobacco: Never Used  Substance and Sexual Activity  . Alcohol use: Yes    Alcohol/week: 2.0 standard drinks    Types: 2 Shots of liquor per week  . Drug use: No  . Sexual activity: Not on file  Lifestyle  . Physical activity:    Days per week: Not on file    Minutes per session: Not on file  . Stress: Not on file  Relationships  . Social connections:    Talks on phone: Not on file    Gets together: Not on file    Attends religious service: Not on file    Active member of club or organization: Not on file    Attends meetings  of clubs or organizations: Not on file    Relationship status: Not on file  . Intimate partner violence:    Fear of current or ex partner: Not on file    Emotionally abused: Not on file    Physically abused: Not on file    Forced sexual activity: Not on file  Other Topics Concern  . Not on file  Social History Narrative   Patient is left-handed. He lives with his wife and daughter in  2 story house. He drinks 1 soda a day. He has not been exercising.    REVIEW OF SYSTEMS: Constitutional: No fevers, chills, or sweats, no generalized fatigue, change in appetite Eyes: No visual changes, double vision, eye pain Ear, nose and throat: No hearing loss, ear pain, nasal congestion, sore throat Cardiovascular: No chest pain, palpitations Respiratory:  No shortness of breath at rest or with exertion, wheezes GastrointestinaI: No nausea, vomiting, diarrhea, abdominal pain, fecal incontinence Genitourinary:  No dysuria, urinary retention or frequency Musculoskeletal:  No neck pain, back pain Integumentary: No rash, pruritus, skin lesions Neurological: as above Psychiatric: No depression, insomnia, anxiety Endocrine: No palpitations, fatigue, diaphoresis, mood swings, change in appetite, change in weight, increased thirst Hematologic/Lymphatic:  No purpura, petechiae. Allergic/Immunologic: no itchy/runny eyes, nasal congestion, recent allergic reactions, rashes  PHYSICAL EXAM: Blood pressure 106/78, pulse 91, height 6\' 3"  (1.905 m), weight 257 lb (116.6 kg), SpO2 95 %. General: No acute distress.  Patient appears well-groomed. Head:  Normocephalic/atraumatic Eyes:  Fundi  examined but not visualized Neck: supple, no paraspinal tenderness, full range of motion Heart:  Regular rate and rhythm Lungs:  Clear to auscultation bilaterally Back: No paraspinal tenderness Neurological Exam: alert and oriented to person, place, and time. Attention span and concentration intact, recent and remote memory  intact, fund of knowledge intact.  Speech fluent and not dysarthric, language intact.  CN II-XII intact. Bulk and tone normal, muscle strength 5/5 throughout.  Sensation to light touch  intact.  Deep tendon reflexes 2+ throughout.  Finger to nose testing intact.  Gait normal, Romberg negative.  IMPRESSION: Chronic cluster headache, intractable  PLAN: 1.  Start Emgality today 2.  Prior side effect to topiramate was worsening headache which I believe was not related to the topiramate.  He is willing to give it another shot.  25mg  at bedtime for 1 week, then 50mg  at bedtime 3.  Stop the antihistamine spray, as it may contribute to rebound effect. 4.  Excedrin.  Limit use of pain relievers to no more than 2 days out of week to prevent risk of rebound or medication-overuse headache. 5  Keep headache diary 6.  Follow up in 3 to 4 months  Shon MilletAdam Quinta Eimer, DO

## 2018-12-13 ENCOUNTER — Ambulatory Visit (INDEPENDENT_AMBULATORY_CARE_PROVIDER_SITE_OTHER): Payer: 59 | Admitting: Neurology

## 2018-12-13 ENCOUNTER — Encounter: Payer: Self-pay | Admitting: Neurology

## 2018-12-13 VITALS — BP 106/78 | HR 91 | Ht 75.0 in | Wt 257.0 lb

## 2018-12-13 DIAGNOSIS — G43009 Migraine without aura, not intractable, without status migrainosus: Secondary | ICD-10-CM

## 2018-12-13 DIAGNOSIS — G44021 Chronic cluster headache, intractable: Secondary | ICD-10-CM | POA: Diagnosis not present

## 2018-12-13 MED ORDER — GALCANEZUMAB-GNLM 120 MG/ML ~~LOC~~ SOAJ: 240.0000 mg | Freq: Once | SUBCUTANEOUS | 0 refills | Status: AC

## 2018-12-13 MED ORDER — TOPIRAMATE 25 MG PO TABS: 50.0000 mg | ORAL_TABLET | Freq: Every day | ORAL | 3 refills | Status: DC

## 2018-12-13 MED ORDER — GALCANEZUMAB-GNLM 120 MG/ML ~~LOC~~ SOAJ: 120.0000 mg | SUBCUTANEOUS | 11 refills | Status: AC

## 2018-12-13 NOTE — Patient Instructions (Signed)
1.  We will increase topiramate tablet.  Take 1 tablet at bedtime for one week, then start 2 tablets at bedtime. 2.  Start Emgality today 3.  Stop the nasal spray.  Limit use of pain relievers (Excedrin) to no more than 2 days out of week to prevent risk of rebound or medication-overuse headache. 4.  Keep headache diary 5.  Follow up in 3 to 4 months

## 2019-01-03 NOTE — Progress Notes (Unsigned)
Rcvd fax from [facility]. Emgality approved through 04/03/2019

## 2019-04-20 ENCOUNTER — Ambulatory Visit: Payer: 59 | Admitting: Neurology

## 2019-05-23 ENCOUNTER — Other Ambulatory Visit: Payer: Self-pay | Admitting: Neurology

## 2019-05-24 NOTE — Telephone Encounter (Signed)
Requested Prescriptions   Pending Prescriptions Disp Refills  . topiramate (TOPAMAX) 25 MG tablet [Pharmacy Med Name: TOPIRAMATE 25MG  TABLETS] 60 tablet 3    Sig: TAKE 2 TABLETS(50 MG) BY MOUTH AT BEDTIME   Rx last filled:12/13/18 #60 3 refills  Pt last seen: 12/13/18   Follow up appt scheduled:none  Sent with no refills until patient is seen in office  Per last visit follow up 2-3 months Approved sent

## 2019-05-29 ENCOUNTER — Telehealth: Payer: Self-pay | Admitting: Neurology

## 2019-05-29 NOTE — Telephone Encounter (Signed)
Called and LMOVM advised Pt to use OTC 200 mg and take 4 tablets

## 2019-05-29 NOTE — Telephone Encounter (Signed)
Left message with after hour service on 05-29-19 @12 :47 pm    Caller states he needs a RX for the ibuprofen 800mg  for headache

## 2019-09-26 ENCOUNTER — Other Ambulatory Visit: Payer: Self-pay | Admitting: Neurology

## 2019-09-26 NOTE — Telephone Encounter (Signed)
Requested Prescriptions   Pending Prescriptions Disp Refills  . topiramate (TOPAMAX) 25 MG tablet [Pharmacy Med Name: TOPIRAMATE 25MG  TABLETS] 60 tablet 3    Sig: TAKE 2 TABLETS(50 MG) BY MOUTH AT BEDTIME   Rx last filled: 05/24/19 #60 3 refills  Pt last seen: 12/13/18   Follow up appt scheduled:none

## 2020-01-30 ENCOUNTER — Other Ambulatory Visit: Payer: Self-pay | Admitting: Neurology

## 2020-04-21 IMAGING — MR MR HEAD WO/W CM
10 of 12 series · 38 of 48 positions shown · IV contrast (MULTIHANCE)
Comparison: Sinus CT 08/19/2011

CLINICAL DATA: History of cluster headaches. Previous sinus
surgery. Right frontotemporal headaches. Pressure sensation.
Moderate intensity. 2 hours duration. Daily frequency, usually in
the morning.

EXAM:
MRI HEAD WITHOUT AND WITH CONTRAST
TECHNIQUE: Multiplanar, multiecho pulse sequences of the brain and surrounding
structures were obtained without and with intravenous contrast.
CONTRAST:  20mL MULTIHANCE GADOBENATE DIMEGLUMINE 529 MG/ML IV SOLN

[Series 2: T1 · sagittal · 5.0mm · 0.49mm/px · 2 of 23 slices shown]
[im 1/23]
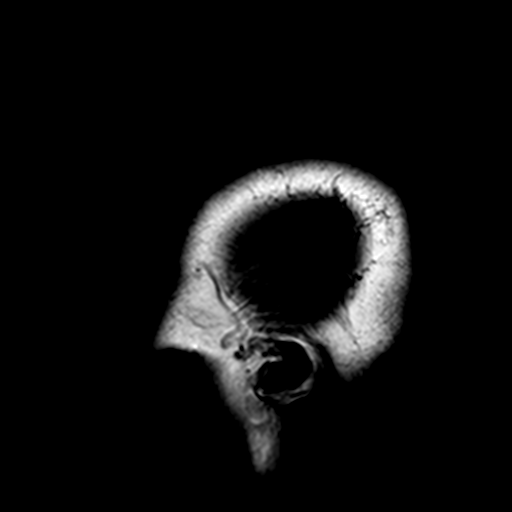
[im 23/23]
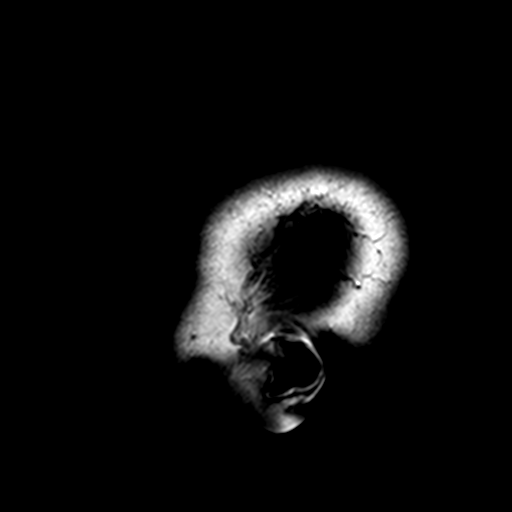

[Series 3: DWI · axial · 3.0mm · 2.19mm/px · z∈[-59,+96]mm · 8 of 96 slices shown (1 of 4)]
[im 1/96]
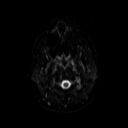
[im 14/96]
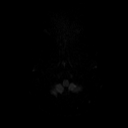
[im 28/96]
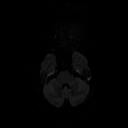
[im 41/96]
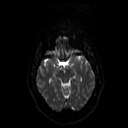
[im 55/96]
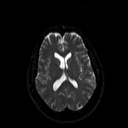
[im 68/96]
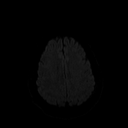
[im 82/96]
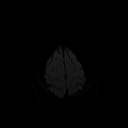
[im 96/96]
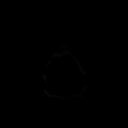

[Series 4: DWI · axial · 3.0mm · 2.19mm/px · z∈[-59,+96]mm · 4 of 48 slices shown (2 of 4)]
[im 1/48]
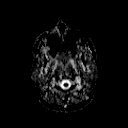
[im 16/48]
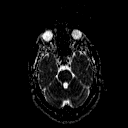
[im 32/48]
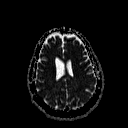
[im 48/48]
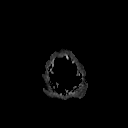

[Series 5: DWI · coronal · 3.0mm · 1.46mm/px · 8 of 99 slices shown (3 of 4)]
[im 1/99]
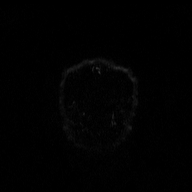
[im 15/99]
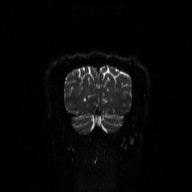
[im 29/99]
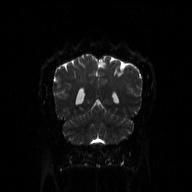
[im 43/99]
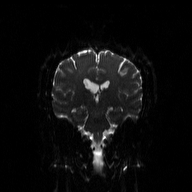
[im 57/99]
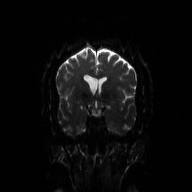
[im 71/99]
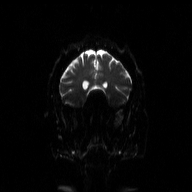
[im 85/99]
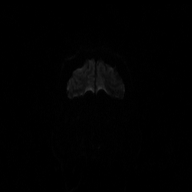
[im 99/99]
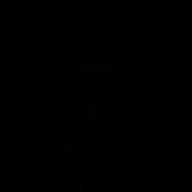

[Series 6: DWI · coronal · 3.0mm · 1.46mm/px · 4 of 50 slices shown (4 of 4)]
[im 1/50]
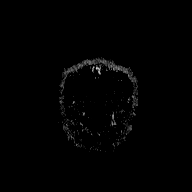
[im 17/50]
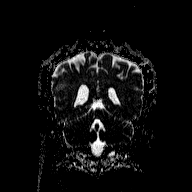
[im 33/50]
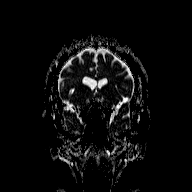
[im 50/50]
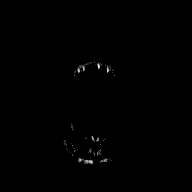

[Series 7: T2 · axial · 5.0mm · 0.47mm/px · z∈[-65,+95]mm · 2 of 24 slices shown (1 of 2)]
[im 1/24]
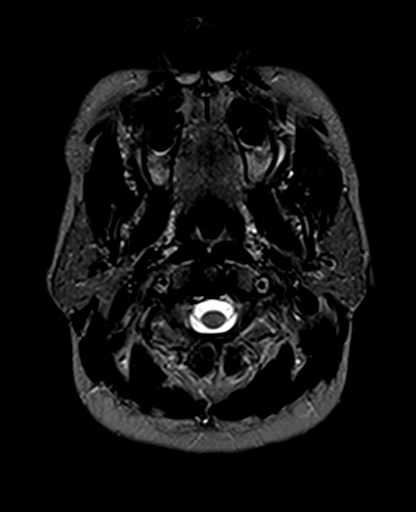
[im 24/24]
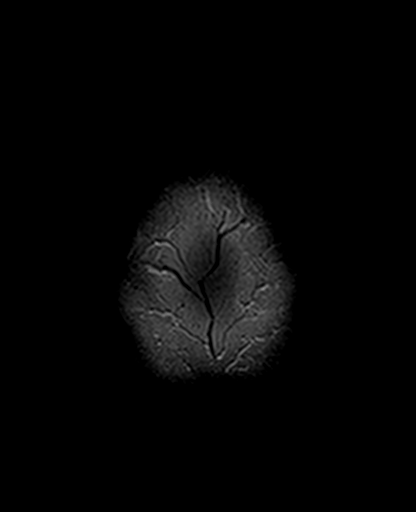

[Series 8: FLAIR · axial · 3.0mm · 0.47mm/px · z∈[-63,+93]mm · 2 of 27 slices shown]
[im 1/27]
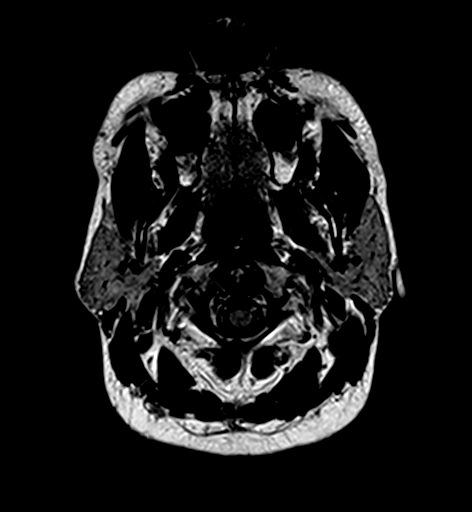
[im 27/27]
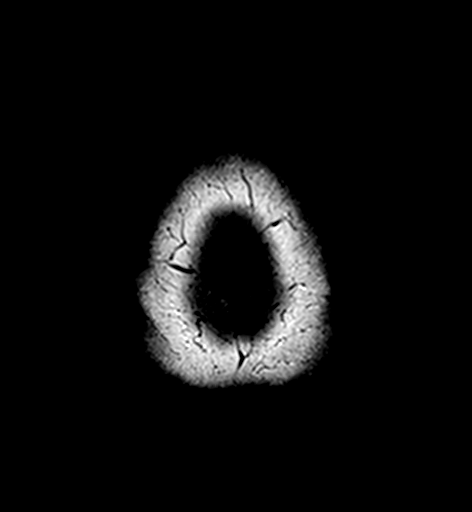

[Series 9: T2 · axial · 5.0mm · 0.47mm/px · z∈[-65,+95]mm · 2 of 24 slices shown (2 of 2)]
[im 1/24]
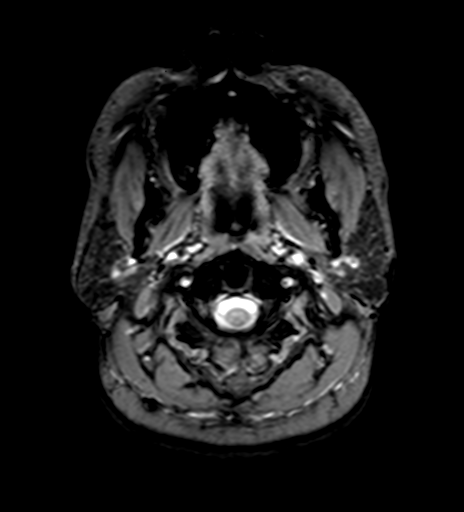
[im 24/24]
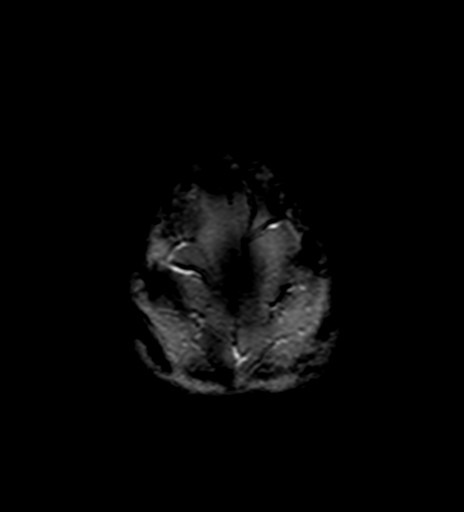

[Series 11: T2 post-contrast · coronal · 5.0mm · 0.45mm/px · 3 of 30 slices shown]
[im 1/30]
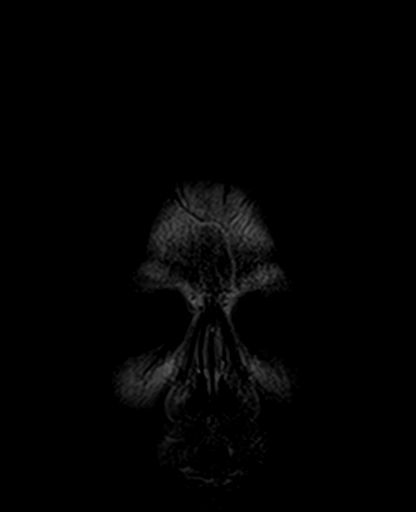
[im 15/30]
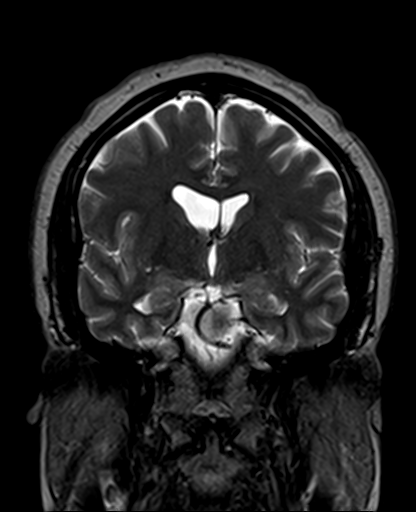
[im 30/30]
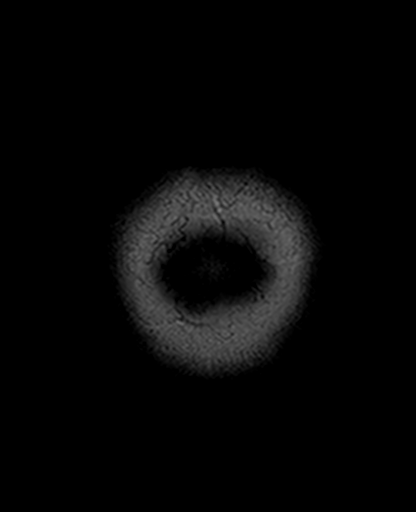

[Series 13: T1 post-contrast · coronal · 5.0mm · 0.45mm/px · 3 of 30 slices shown]
[im 1/30]
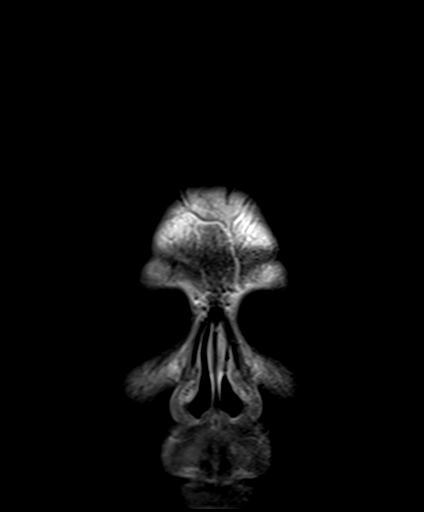
[im 15/30]
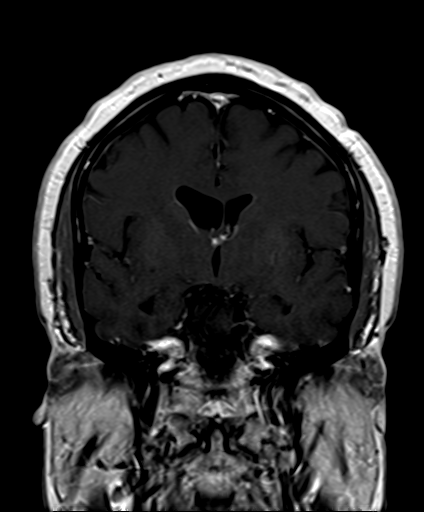
[im 30/30]
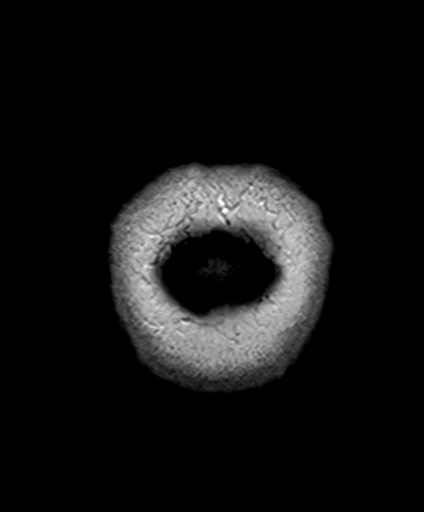

[38 of 48 positions shown; findings below may reference images not displayed]

FINDINGS: Brain: The brain has a normal appearance without evidence of
malformation, atrophy, old or acute small or large vessel
infarction, mass lesion, hemorrhage, hydrocephalus or extra-axial
collection. After contrast administration, no abnormal enhancement
occurs.

Vascular: Major vessels at the base of the brain show flow. Venous
sinuses appear patent.

Skull and upper cervical spine: Normal.

Sinuses/Orbits: Clear/normal.

Other: None significant.
IMPRESSION: Normal examination.  No abnormality seen to explain headaches.

## 2020-06-02 ENCOUNTER — Encounter: Payer: Self-pay | Admitting: Gastroenterology

## 2020-08-01 ENCOUNTER — Encounter: Payer: 59 | Admitting: Gastroenterology

## 2020-08-05 ENCOUNTER — Encounter: Payer: Self-pay | Admitting: Gastroenterology

## 2020-09-08 ENCOUNTER — Encounter: Payer: Self-pay | Admitting: *Deleted

## 2020-09-08 DIAGNOSIS — B029 Zoster without complications: Secondary | ICD-10-CM | POA: Insufficient documentation

## 2020-09-08 DIAGNOSIS — H109 Unspecified conjunctivitis: Secondary | ICD-10-CM | POA: Insufficient documentation

## 2020-09-08 DIAGNOSIS — J019 Acute sinusitis, unspecified: Secondary | ICD-10-CM | POA: Insufficient documentation

## 2020-10-07 ENCOUNTER — Encounter: Payer: 59 | Admitting: Gastroenterology

## 2024-03-06 ENCOUNTER — Other Ambulatory Visit: Payer: Self-pay | Admitting: Urology

## 2024-03-06 DIAGNOSIS — C61 Malignant neoplasm of prostate: Secondary | ICD-10-CM

## 2024-03-13 ENCOUNTER — Encounter: Payer: Self-pay | Admitting: Urology

## 2024-04-18 ENCOUNTER — Ambulatory Visit
Admission: RE | Admit: 2024-04-18 | Discharge: 2024-04-18 | Disposition: A | Discharge status: Home / Self Care | Source: Ambulatory Visit | Attending: Urology | Admitting: Urology

## 2024-04-18 DIAGNOSIS — C61 Malignant neoplasm of prostate: Secondary | ICD-10-CM

## 2024-04-18 MED ORDER — GADOPICLENOL 0.5 MMOL/ML IV SOLN
10.0000 mL | Freq: Once | INTRAVENOUS | Status: AC | PRN
  Administered 2024-04-18: 10 mL via INTRAVENOUS
